# Patient Record
Sex: Female | Born: 1995 | Race: Black or African American | Hispanic: No | Smoking: Former smoker
Health system: Southern US, Community
[De-identification: ages and names within clinical notes are randomized; demographics above are authoritative.]

## PROBLEM LIST (undated history)

## (undated) DIAGNOSIS — J329 Chronic sinusitis, unspecified: Secondary | ICD-10-CM

## (undated) DIAGNOSIS — E669 Obesity, unspecified: Secondary | ICD-10-CM

## (undated) DIAGNOSIS — J302 Other seasonal allergic rhinitis: Secondary | ICD-10-CM

---
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
## 2014-01-24 ENCOUNTER — Encounter (HOSPITAL_COMMUNITY): Payer: Self-pay | Admitting: Emergency Medicine

## 2014-01-24 ENCOUNTER — Emergency Department (HOSPITAL_COMMUNITY)
Admission: EM | Admit: 2014-01-24 | Discharge: 2014-01-24 | Discharge status: Against Medical Advice | Payer: Medicaid Other | Attending: Emergency Medicine | Admitting: Emergency Medicine

## 2014-01-24 DIAGNOSIS — R300 Dysuria: Secondary | ICD-10-CM | POA: Insufficient documentation

## 2014-01-24 DIAGNOSIS — J45909 Unspecified asthma, uncomplicated: Secondary | ICD-10-CM | POA: Insufficient documentation

## 2014-01-24 DIAGNOSIS — E669 Obesity, unspecified: Secondary | ICD-10-CM | POA: Insufficient documentation

## 2014-01-24 NOTE — ED Notes (Signed)
Pt reports dysuria and urinary frequency x 4 days. Denies hematuria. Reports 7/10 bilateral flank pain. Pt  In NAD. Denies fever/chills.

## 2014-01-24 NOTE — ED Notes (Addendum)
The patient was paged and called by name several time and no answer.   The patient left without being seen after triage.

## 2014-01-27 ENCOUNTER — Emergency Department (HOSPITAL_COMMUNITY)
Admission: EM | Admit: 2014-01-27 | Discharge: 2014-01-27 | Disposition: A | Discharge status: Home / Self Care | Payer: Medicaid Other | Attending: Emergency Medicine | Admitting: Emergency Medicine

## 2014-01-27 ENCOUNTER — Encounter (HOSPITAL_COMMUNITY): Payer: Self-pay | Admitting: Emergency Medicine

## 2014-01-27 DIAGNOSIS — N390 Urinary tract infection, site not specified: Secondary | ICD-10-CM | POA: Diagnosis not present

## 2014-01-27 DIAGNOSIS — Z3202 Encounter for pregnancy test, result negative: Secondary | ICD-10-CM | POA: Diagnosis not present

## 2014-01-27 DIAGNOSIS — J45909 Unspecified asthma, uncomplicated: Secondary | ICD-10-CM | POA: Insufficient documentation

## 2014-01-27 DIAGNOSIS — Z792 Long term (current) use of antibiotics: Secondary | ICD-10-CM | POA: Insufficient documentation

## 2014-01-27 DIAGNOSIS — Z880 Allergy status to penicillin: Secondary | ICD-10-CM | POA: Insufficient documentation

## 2014-01-27 DIAGNOSIS — R300 Dysuria: Secondary | ICD-10-CM | POA: Diagnosis present

## 2014-01-27 DIAGNOSIS — E669 Obesity, unspecified: Secondary | ICD-10-CM | POA: Insufficient documentation

## 2014-01-27 LAB — URINE MICROSCOPIC-ADD ON

## 2014-01-27 LAB — URINALYSIS, ROUTINE W REFLEX MICROSCOPIC
BILIRUBIN URINE: NEGATIVE
Glucose, UA: NEGATIVE mg/dL
Ketones, ur: NEGATIVE mg/dL
Nitrite: NEGATIVE
PROTEIN: 100 mg/dL — AB
Specific Gravity, Urine: 1.026 (ref 1.005–1.030)
UROBILINOGEN UA: 1 mg/dL (ref 0.0–1.0)
pH: 6 (ref 5.0–8.0)

## 2014-01-27 LAB — PREGNANCY, URINE: PREG TEST UR: NEGATIVE

## 2014-01-27 MED ORDER — PHENAZOPYRIDINE HCL 200 MG PO TABS: 200.0000 mg | ORAL_TABLET | Freq: Three times a day (TID) | ORAL | Status: AC

## 2014-01-27 MED ORDER — SULFAMETHOXAZOLE-TRIMETHOPRIM 800-160 MG PO TABS: 1.0000 | ORAL_TABLET | Freq: Two times a day (BID) | ORAL | Status: DC

## 2014-01-27 NOTE — ED Provider Notes (Signed)
Medical screening examination/treatment/procedure(s) were performed by non-physician practitioner and as supervising physician I was immediately available for consultation/collaboration.   EKG Interpretation None        Margrete Delude, MD 01/27/14 0410 

## 2014-01-27 NOTE — ED Notes (Signed)
Pt. reports dysuria , cloudy malodorous urine onset 2 days ago , denies  fever or chills.

## 2014-01-27 NOTE — ED Notes (Signed)
Unable to give urine specimen at this time pt. stated she just voided .

## 2014-01-27 NOTE — ED Provider Notes (Signed)
CSN: [id number]     Arrival date & time 01/27/14  0049 History   First MD Initiated Contact with Patient 01/27/14 0136     Chief Complaint  Patient presents with  . Urinary Tract Infection     (Consider location/radiation/quality/duration/timing/severity/associated sxs/prior Treatment) Patient is a 18 y.o. female presenting with urinary tract infection. The history is provided by the patient.  Urinary Tract Infection This is a new problem. The current episode started in the past 7 days. The problem occurs constantly. The problem has been gradually worsening. Associated symptoms include urinary symptoms. She has tried nothing for the symptoms.   Ruth Moore is a 18 y.o. female who presents to the ED with UTI symptoms that started 2 days ago. She complains of dysuria, frequency and cloudy urine with a bad odor. She states she feels the urge to go but then just goes a few drops and feels like she needs to go again. She denies vaginal bleeding or discharge.  Past Medical History  Diagnosis Date  . Asthma   . Sinusitis   . Seasonal allergies   . Obesity    History reviewed. No pertinent past surgical history. No family history on file. History  Substance Use Topics  . Smoking status: Never Smoker   . Smokeless tobacco: Not on file  . Alcohol Use: No   OB History   Grav Para Term Preterm Abortions TAB SAB Ect Mult Living                 Review of Systems  Genitourinary: Positive for dysuria, urgency and frequency.  all other systems negative    Allergies  Penicillins  Home Medications   Prior to Admission medications   Medication Sig Start Date End Date Taking? Authorizing Provider  cephALEXin (KEFLEX) 500 MG capsule Take 1 capsule (500 mg total) by mouth 2 (two) times daily. 12/01/12   [name], PA-C  ibuprofen (ADVIL,MOTRIN) 800 MG tablet Take 1 tablet (800 mg total) by mouth 3 (three) times daily. 12/05/12   Roxy Horseman, PA-C   BP 130/70  Pulse 92   Temp(Src) 98.3 F (36.8 C) (Oral)  Resp 18  SpO2 100%  LMP 01/10/2014 Physical Exam  Nursing note and vitals reviewed. Constitutional: She is oriented to person, place, and time. She appears well-developed and well-nourished.  HENT:  Head: Normocephalic.  Eyes: EOM are normal.  Neck: Neck supple.  Cardiovascular: Normal rate.   Pulmonary/Chest: Effort normal.  Abdominal: Soft. There is tenderness in the suprapubic area. There is no rebound, no guarding and no CVA tenderness.  Musculoskeletal: Normal range of motion.  Neurological: She is alert and oriented to person, place, and time. No cranial nerve deficit.  Skin: Skin is warm and dry.  Psychiatric: She has a normal mood and affect. Her behavior is normal.   Results for orders placed during the hospital encounter of 01/27/14 (from the past 24 hour(s))  URINALYSIS, ROUTINE W REFLEX MICROSCOPIC     Status: Abnormal   Collection Time    01/27/14  1:45 AM      Result Value Ref Range   Color, Urine YELLOW  YELLOW   APPearance TURBID (*) CLEAR   Specific Gravity, Urine 1.026  1.005 - 1.030   pH 6.0  5.0 - 8.0   Glucose, UA NEGATIVE  NEGATIVE mg/dL   Hgb urine dipstick MODERATE (*) NEGATIVE   Bilirubin Urine NEGATIVE  NEGATIVE   Ketones, ur NEGATIVE  NEGATIVE mg/dL   Protein, ur  100 (*) NEGATIVE mg/dL   Urobilinogen, UA 1.0  0.0 - 1.0 mg/dL   Nitrite NEGATIVE  NEGATIVE   Leukocytes, UA LARGE (*) NEGATIVE  PREGNANCY, URINE     Status: None   Collection Time    01/27/14  1:45 AM      Result Value Ref Range   Preg Test, Ur NEGATIVE  NEGATIVE  URINE MICROSCOPIC-ADD ON     Status: Abnormal   Collection Time    01/27/14  1:45 AM      Result Value Ref Range   Squamous Epithelial / LPF FEW (*) RARE   WBC, UA TOO NUMEROUS TO COUNT  <3 WBC/hpf   RBC / HPF 11-20  <3 RBC/hpf   Bacteria, UA MANY (*) RARE    ED Course  Procedures (  MDM  18 y.o. female with UTI symptoms x 2 days. Stable for discharge without n/v, fever or signs  of pyelonephritis. I have reviewed this patient's vital signs, nurses notes, appropriate labs and imaging. I have discussed findings with the patient and plan of care. She voices understanding and agrees with plan.    Medication List    TAKE these medications       phenazopyridine 200 MG tablet  Commonly known as:  PYRIDIUM  Take 1 tablet (200 mg total) by mouth 3 (three) times daily.     sulfamethoxazole-trimethoprim 800-160 MG per tablet  Commonly known as:  SEPTRA DS  Take 1 tablet by mouth every 12 (twelve) hours.      ASK your doctor about these medications       cephALEXin 500 MG capsule  Commonly known as:  KEFLEX  Take 1 capsule (500 mg total) by mouth 2 (two) times daily.     ibuprofen 800 MG tablet  Commonly known as:  ADVIL,MOTRIN  Take 1 tablet (800 mg total) by mouth 3 (three) times daily.            [facility] Orlene Och, [place] 01/27/14 [phone]

## 2015-08-14 ENCOUNTER — Emergency Department (HOSPITAL_COMMUNITY): Payer: No Typology Code available for payment source

## 2015-08-14 ENCOUNTER — Encounter (HOSPITAL_COMMUNITY): Payer: Self-pay | Admitting: Emergency Medicine

## 2015-08-14 ENCOUNTER — Emergency Department (HOSPITAL_COMMUNITY)
Admission: EM | Admit: 2015-08-14 | Discharge: 2015-08-14 | Disposition: A | Discharge status: Home / Self Care | Payer: No Typology Code available for payment source | Attending: Emergency Medicine | Admitting: Emergency Medicine

## 2015-08-14 DIAGNOSIS — S8992XA Unspecified injury of left lower leg, initial encounter: Secondary | ICD-10-CM | POA: Diagnosis not present

## 2015-08-14 DIAGNOSIS — Z880 Allergy status to penicillin: Secondary | ICD-10-CM | POA: Diagnosis not present

## 2015-08-14 DIAGNOSIS — Y9389 Activity, other specified: Secondary | ICD-10-CM | POA: Diagnosis not present

## 2015-08-14 DIAGNOSIS — E669 Obesity, unspecified: Secondary | ICD-10-CM | POA: Insufficient documentation

## 2015-08-14 DIAGNOSIS — Z792 Long term (current) use of antibiotics: Secondary | ICD-10-CM | POA: Insufficient documentation

## 2015-08-14 DIAGNOSIS — Y92410 Unspecified street and highway as the place of occurrence of the external cause: Secondary | ICD-10-CM | POA: Diagnosis not present

## 2015-08-14 DIAGNOSIS — J45909 Unspecified asthma, uncomplicated: Secondary | ICD-10-CM | POA: Insufficient documentation

## 2015-08-14 DIAGNOSIS — Z791 Long term (current) use of non-steroidal anti-inflammatories (NSAID): Secondary | ICD-10-CM | POA: Diagnosis not present

## 2015-08-14 DIAGNOSIS — Y998 Other external cause status: Secondary | ICD-10-CM | POA: Insufficient documentation

## 2015-08-14 DIAGNOSIS — M25562 Pain in left knee: Secondary | ICD-10-CM

## 2015-08-14 DIAGNOSIS — Z79899 Other long term (current) drug therapy: Secondary | ICD-10-CM | POA: Insufficient documentation

## 2015-08-14 MED ORDER — IBUPROFEN 800 MG PO TABS: 800.0000 mg | ORAL_TABLET | Freq: Three times a day (TID) | ORAL | Status: DC
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
## 2015-08-14 NOTE — ED Notes (Signed)
LLE elevated and ice pack place on anterior left knee. Pt describes tingling to her toes on the left foot. Will continue to monitor.

## 2016-04-18 ENCOUNTER — Emergency Department (HOSPITAL_COMMUNITY)
Admission: EM | Admit: 2016-04-18 | Discharge: 2016-04-18 | Disposition: A | Discharge status: Home / Self Care | Payer: Medicaid Other | Attending: Emergency Medicine | Admitting: Emergency Medicine

## 2016-04-18 ENCOUNTER — Encounter (HOSPITAL_COMMUNITY): Payer: Self-pay | Admitting: Emergency Medicine

## 2016-04-18 DIAGNOSIS — M545 Low back pain: Secondary | ICD-10-CM | POA: Diagnosis present

## 2016-04-18 DIAGNOSIS — J45909 Unspecified asthma, uncomplicated: Secondary | ICD-10-CM | POA: Insufficient documentation

## 2016-04-18 DIAGNOSIS — M5441 Lumbago with sciatica, right side: Secondary | ICD-10-CM | POA: Insufficient documentation

## 2016-04-18 MED ORDER — NAPROXEN 500 MG PO TABS: 500.0000 mg | ORAL_TABLET | Freq: Two times a day (BID) | ORAL | 0 refills | Status: DC

## 2016-04-18 MED ORDER — METHOCARBAMOL 500 MG PO TABS: 1000.0000 mg | ORAL_TABLET | Freq: Four times a day (QID) | ORAL | 0 refills | Status: AC

## 2016-04-18 NOTE — ED Triage Notes (Signed)
Patient states she has lower back pain x 2 weeks associated with right leg numbness.  Denies any injury, falls, or trauma.

## 2016-04-18 NOTE — Discharge Instructions (Signed)
Please read and follow all provided instructions.  Your diagnoses today include:  1. Acute right-sided low back pain with right-sided sciatica     Tests performed today include:  Vital signs - see below for your results today  Medications prescribed:   Naproxen - anti-inflammatory pain medication  Do not exceed 500mg  naproxen every 12 hours, take with food  You have been prescribed an anti-inflammatory medication or NSAID. Take with food. Take smallest effective dose for the shortest duration needed for your pain. Stop taking if you experience stomach pain or vomiting.    Robaxin (methocarbamol) - muscle relaxer medication  DO NOT drive or perform any activities that require you to be awake and alert because this medicine can make you drowsy.   Take any prescribed medications only as directed.  Home care instructions:   Follow any educational materials contained in this packet  Please rest, use ice or heat on your back for the next several days  Do not lift, push, pull anything more than 10 pounds for the next week  Follow-up instructions: Please follow-up with your primary care provider in the next 1 week for further evaluation of your symptoms.   Return instructions:  SEEK IMMEDIATE MEDICAL ATTENTION IF YOU HAVE:  New numbness, tingling, weakness, or problem with the use of your arms or legs  Severe back pain not relieved with medications  Loss control of your bowels or bladder  Increasing pain in any areas of the body (such as chest or abdominal pain)  Shortness of breath, dizziness, or fainting.   Worsening nausea (feeling sick to your stomach), vomiting, fever, or sweats  Any other emergent concerns regarding your health   Additional Information:  Your vital signs today were: BP 137/76 (BP Location: Right Arm)    Pulse 86    Temp 98.2 F (36.8 C) (Oral)    Resp 16    Ht 5\' 4"  (1.626 m)    LMP 04/14/2016    SpO2 100%  If your blood pressure (BP) was  elevated above 135/85 this visit, please have this repeated by your doctor within one month. --------------

## 2016-04-18 NOTE — ED Provider Notes (Signed)
[facility] Provider Note   CSN: [id number] Arrival date & time: 04/18/16  1600  By signing my name below, I, Teofilo PodMatthew P. Jamison, attest that this documentation has been prepared under the direction and in the presence of Renne CriglerJoshua Xiadani Damman, [place]. Electronically Signed: Teofilo PodMatthew P. Jamison, ED Scribe. 04/18/2016. 5:25 PM.   History   Chief Complaint Chief Complaint  Patient presents with  . Back Pain    The history is provided by the patient. No language interpreter was used.   HPI Comments:  Ruth Moore is a 20 y.o. female who presents to the Emergency Department complaining of intermittent lower back pain x 2-3weeks. Pt states that the pain comes in multiple brief episodes every day. Pt notes that sometimes she wakes up and the pain is so severe that it is difficult to get out of bed. Pt states that at work today she bent over and then she had a sharp pain in her back radiating down her right leg with some numbness and tingling, and states that she is currently having the same pain. Pt states that the leg pain is at the inner right thigh. Pt describes the pain as sharp and rates the pain at 9/10. Pt states that the pain is exacerbated with bending over and sitting. Pt has used ice, heating pads, tylenol and ibuprofen with no relief. Pt denies any previous similar pain, denies injury/trauma, and denies IV drug use. Pt denies saddle anesthesia, bowel/bladder incontinence, weakness.   Past Medical History:  Diagnosis Date  . Asthma   . Obesity   . Seasonal allergies   . Sinusitis     There are no active problems to display for this patient.   History reviewed. No pertinent surgical history.  OB History    No data available       Home Medications    Prior to Admission medications   Medication Sig Start Date End Date Taking? Authorizing Provider  cephALEXin (KEFLEX) 500 MG capsule Take 1 capsule (500 mg total) by mouth 2 (two) times daily. 12/01/12   Junious SilkHannah Merrell, PA-C    ibuprofen (ADVIL,MOTRIN) 800 MG tablet Take 1 tablet (800 mg total) by mouth 3 (three) times daily. 08/14/15   Elpidio AnisShari Upstill, PA-C  phenazopyridine (PYRIDIUM) 200 MG tablet Take 1 tablet (200 mg total) by mouth 3 (three) times daily. 01/27/14   Hope Orlene OchM Neese, NP  sulfamethoxazole-trimethoprim (SEPTRA DS) 800-160 MG per tablet Take 1 tablet by mouth every 12 (twelve) hours. 01/27/14   Hope Orlene OchM Neese, NP    Family History No family history on file.  Social History Social History  Substance Use Topics  . Smoking status: Never Smoker  . Smokeless tobacco: Never Used  . Alcohol use No     Allergies   Penicillins   Review of Systems Review of Systems  Constitutional: Negative for fever and unexpected weight change.  Gastrointestinal: Negative for constipation.       Negative for fecal incontinence.   Genitourinary: Negative for dysuria, flank pain, hematuria, pelvic pain, vaginal bleeding and vaginal discharge.       Negative for urinary incontinence or retention.  Musculoskeletal: Positive for back pain and myalgias.  Neurological: Positive for numbness. Negative for weakness.       Denies saddle paresthesias.     Physical Exam Updated Vital Signs BP 137/76 (BP Location: Right Arm)   Pulse 86   Temp 98.2 F (36.8 C) (Oral)   Resp 16   Ht 5\' 4"  (1.626 m)  LMP 04/14/2016   SpO2 100%   Physical Exam  Constitutional: She appears well-developed and well-nourished. No distress.  HENT:  Head: Normocephalic and atraumatic.  Eyes: Conjunctivae are normal.  Neck: Normal range of motion. Neck supple.  Cardiovascular: Normal rate.   Pulmonary/Chest: Effort normal.  Abdominal: Soft. She exhibits no distension. There is no tenderness. There is no CVA tenderness.  Musculoskeletal:       Cervical back: She exhibits normal range of motion, no tenderness and no bony tenderness.       Thoracic back: She exhibits normal range of motion, no tenderness and no bony tenderness.       Lumbar  back: She exhibits decreased range of motion (decreased flexion) and tenderness (R>L). She exhibits no bony tenderness.  No step-off noted with palpation of spine.   Neurological: She is alert. She has normal strength and normal reflexes. No sensory deficit.  5/5 strength in entire lower extremities bilaterally. No sensation deficit. Ambulatory without foot drop.  Skin: Skin is warm and dry. No rash noted.  Psychiatric: She has a normal mood and affect.  Nursing note and vitals reviewed.    ED Treatments / Results  DIAGNOSTIC STUDIES:  Oxygen Saturation is 100% on RA, normal by my interpretation.    COORDINATION OF CARE:  5:25 PM Will order naproxen and robaxin. Discussed treatment plan with pt at bedside and pt agreed to plan.   Procedures Procedures (including critical care time)  Initial Impression / Assessment and Plan / ED Course  I have reviewed the triage vital signs and the nursing notes.  Pertinent labs & imaging results that were available during my care of the patient were reviewed by me and considered in my medical decision making (see chart for details).  Clinical Course    Vital signs reviewed and are as follows: Vitals:   04/18/16 1615 04/18/16 1747  BP: 137/76 141/88  Pulse: 86 87  Resp: 16 17  Temp: 98.2 F (36.8 C)     No red flag s/s of low back pain. Patient was counseled on back pain precautions and told to do activity as tolerated but do not lift, push, or pull heavy objects more than 10 pounds for the next week.  Patient counseled to use ice or heat on back for no longer than 15 minutes every hour.   Patient counseled on proper use of muscle relaxant medication.  They were told not to drink alcohol, drive any vehicle, or do any dangerous activities while taking this medication.  Patient verbalized understanding.  Patient urged to follow-up with PCP if pain does not improve with treatment and rest or if pain becomes recurrent. Urged to return with  worsening severe pain, loss of bowel or bladder control, trouble walking.   The patient verbalizes understanding and agrees with the plan.  Final Clinical Impressions(s) / ED Diagnoses   Final diagnoses:  Acute right-sided low back pain with right-sided sciatica   Patient with back pain. No neurological deficits. Patient is ambulatory. No warning symptoms of back pain including: fecal incontinence, urinary retention or overflow incontinence, night sweats, waking from sleep with back pain, unexplained fevers or weight loss, h/o cancer, IVDU, recent trauma. No concern for cauda equina, epidural abscess, or other serious cause of back pain. Conservative measures such as rest, ice/heat and pain medicine indicated with PCP follow-up if no improvement with conservative management.    New Prescriptions Discharge Medication List as of 04/18/2016  5:32 PM    START taking  these medications   Details  methocarbamol (ROBAXIN) 500 MG tablet Take 2 tablets (1,000 mg total) by mouth 4 (four) times daily., Starting Fri 04/18/2016, Print    naproxen (NAPROSYN) 500 MG tablet Take 1 tablet (500 mg total) by mouth 2 (two) times daily., Starting Fri 04/18/2016, Print      I personally performed the services described in this documentation, which was scribed in my presence. The recorded information has been reviewed and is accurate.     Renne Crigler, PA-C 04/18/16 1817    Nira Conn, MD 04/20/16 Marlyne Beards

## 2020-08-01 ENCOUNTER — Encounter (HOSPITAL_COMMUNITY): Payer: Self-pay

## 2020-08-01 ENCOUNTER — Ambulatory Visit (HOSPITAL_COMMUNITY)
Admission: EM | Admit: 2020-08-01 | Discharge: 2020-08-01 | Disposition: A | Discharge status: Home / Self Care | Payer: Self-pay | Attending: Medical Oncology | Admitting: Medical Oncology

## 2020-08-01 ENCOUNTER — Other Ambulatory Visit: Payer: Self-pay

## 2020-08-01 DIAGNOSIS — K047 Periapical abscess without sinus: Secondary | ICD-10-CM

## 2020-08-01 MED ORDER — IBUPROFEN 800 MG PO TABS: 800.0000 mg | ORAL_TABLET | Freq: Three times a day (TID) | ORAL | 0 refills | Status: AC

## 2020-08-01 MED ORDER — CLINDAMYCIN HCL 300 MG PO CAPS: 300.0000 mg | ORAL_CAPSULE | Freq: Three times a day (TID) | ORAL | 0 refills | Status: AC

## 2020-08-01 NOTE — ED Triage Notes (Signed)
Pt in with c/o right side dental pain due to abscess on her tooth.  States she can't get into her dentist until next week

## 2020-08-01 NOTE — ED Provider Notes (Addendum)
[facility]    CSN: [id number] Arrival date & time: 08/01/20  4492      History   Chief Complaint Chief Complaint  Patient presents with  . Dental Pain    HPI Ruth Moore is a 25 y.o. female.   HPI   Dental Pain: Patient states that she has had right-sided dental pain for the past 3 days. No known dental trauma. She believes that she has a dental abscess. She states that she is not able to see her dentist until next week. She has tried tylenol for pain control without much relief. No fevers, trouble swallowing, trouble breathing.   Past Medical History:  Diagnosis Date  . Asthma   . Obesity   . Seasonal allergies   . Sinusitis     There are no problems to display for this patient.   History reviewed. No pertinent surgical history.  OB History   No obstetric history on file.      Home Medications    Prior to Admission medications   Medication Sig Start Date End Date Taking? Authorizing Provider  cephALEXin (KEFLEX) 500 MG capsule Take 1 capsule (500 mg total) by mouth 2 (two) times daily. 12/01/12   Junious Silk, PA-C  methocarbamol (ROBAXIN) 500 MG tablet Take 2 tablets (1,000 mg total) by mouth 4 (four) times daily. 04/18/16   Renne Crigler, PA-C  naproxen (NAPROSYN) 500 MG tablet Take 1 tablet (500 mg total) by mouth 2 (two) times daily. 04/18/16   Renne Crigler, PA-C  phenazopyridine (PYRIDIUM) 200 MG tablet Take 1 tablet (200 mg total) by mouth 3 (three) times daily. 01/27/14   Janne Napoleon, NP  sulfamethoxazole-trimethoprim (SEPTRA DS) 800-160 MG per tablet Take 1 tablet by mouth every 12 (twelve) hours. 01/27/14   Janne Napoleon, NP  fexofenadine (ALLEGRA) 180 MG tablet Take 180 mg by mouth daily.  09/10/11  [provider]    Family History History reviewed. No pertinent family history.  Social History Social History   Tobacco Use  . Smoking status: Never Smoker  . Smokeless tobacco: Never Used  Substance Use Topics  . Alcohol  use: No  . Drug use: No     Allergies   Penicillins   Review of Systems Review of Systems  As stated above in HPI Physical Exam Triage Vital Signs ED Triage Vitals  Enc Vitals Group     BP --      Pulse Rate 08/01/20 0947 80     Resp 08/01/20 0947 20     Temp 08/01/20 0947 98.2 F (36.8 C)     Temp src --      SpO2 08/01/20 0947 100 %     Weight --      Height --      Head Circumference --      Peak Flow --      Pain Score 08/01/20 0946 10     Pain Loc --      Pain Edu? --      Excl. in GC? --    No data found.  Updated Vital Signs Pulse 80   Temp 98.2 F (36.8 C)   Resp 20   LMP 07/26/2020 (Approximate)   SpO2 100%   Physical Exam Vitals and nursing note reviewed.  Constitutional:      General: She is not in acute distress.    Appearance: Normal appearance. She is obese. She is not ill-appearing, toxic-appearing or diaphoretic.  HENT:  Head: Normocephalic and atraumatic.     Nose: Nose normal. No congestion or rhinorrhea.     Mouth/Throat:     Mouth: No angioedema.     Dentition: Abnormal dentition. Dental tenderness, dental caries, dental abscesses and gum lesions present.     Tongue: No lesions. Tongue does not deviate from midline.     Palate: No mass and lesions.     Pharynx: Oropharynx is clear. Uvula midline. No pharyngeal swelling, oropharyngeal exudate, posterior oropharyngeal erythema or uvula swelling.     Tonsils: No tonsillar exudate or tonsillar abscesses.      Comments: NO tenderness, bogginess or edema of inferior or superior palate  Cardiovascular:     Rate and Rhythm: Normal rate and regular rhythm.     Heart sounds: Normal heart sounds.  Pulmonary:     Effort: Pulmonary effort is normal.     Breath sounds: Normal breath sounds.  Musculoskeletal:     Cervical back: Normal range of motion and neck supple.  Lymphadenopathy:     Cervical: No cervical adenopathy.  Neurological:     Mental Status: She is alert.      UC  Treatments / Results  Labs (all labs ordered are listed, but only abnormal results are displayed) Labs Reviewed - No data to display  EKG   Radiology No results found.  Procedures Procedures (including critical care time)  Medications Ordered in UC Medications - No data to display  Initial Impression / Assessment and Plan / UC Course  I have reviewed the triage vital signs and the nursing notes.  Pertinent labs & imaging results that were available during my care of the patient were reviewed by me and considered in my medical decision making (see chart for details).     New.  Discussed with patient.  Given her allergies will treat with clindamycin.  We discussed how to use along common potential side effects, precautions and blackbox warnings.  Also sending in Rx strength ibuprofen to use as needed(not to be taken with naproxen as discussed).  We discussed red flag signs and symptoms in regard to her dental abscess as well.  She will keep her appointment with her dentist for next week. Final Clinical Impressions(s) / UC Diagnoses   Final diagnoses:  None   Discharge Instructions   None    ED Prescriptions    None     PDMP not reviewed this encounter.   Rushie Chestnut, PA-C 08/01/20 1001    Rushie Chestnut, [place] 08/01/20 1002

## 2020-11-23 ENCOUNTER — Other Ambulatory Visit: Payer: Self-pay

## 2020-11-23 ENCOUNTER — Emergency Department (HOSPITAL_BASED_OUTPATIENT_CLINIC_OR_DEPARTMENT_OTHER)
Admission: EM | Admit: 2020-11-23 | Discharge: 2020-11-23 | Disposition: A | Discharge status: Home / Self Care | Payer: Medicaid Other | Attending: Emergency Medicine | Admitting: Emergency Medicine

## 2020-11-23 ENCOUNTER — Encounter (HOSPITAL_BASED_OUTPATIENT_CLINIC_OR_DEPARTMENT_OTHER): Payer: Self-pay | Admitting: Emergency Medicine

## 2020-11-23 DIAGNOSIS — Z8616 Personal history of COVID-19: Secondary | ICD-10-CM | POA: Insufficient documentation

## 2020-11-23 DIAGNOSIS — Z28310 Unvaccinated for covid-19: Secondary | ICD-10-CM | POA: Insufficient documentation

## 2020-11-23 DIAGNOSIS — U071 COVID-19: Secondary | ICD-10-CM | POA: Insufficient documentation

## 2020-11-23 DIAGNOSIS — Z6841 Body Mass Index (BMI) 40.0 and over, adult: Secondary | ICD-10-CM | POA: Insufficient documentation

## 2020-11-23 DIAGNOSIS — J069 Acute upper respiratory infection, unspecified: Secondary | ICD-10-CM

## 2020-11-23 DIAGNOSIS — J45909 Unspecified asthma, uncomplicated: Secondary | ICD-10-CM | POA: Insufficient documentation

## 2020-11-23 LAB — RESP PANEL BY RT-PCR (FLU A&B, COVID) ARPGX2
Influenza A by PCR: NEGATIVE
Influenza B by PCR: NEGATIVE
SARS Coronavirus 2 by RT PCR: POSITIVE — AB

## 2020-11-23 NOTE — Discharge Instructions (Addendum)
Your COVID test is pending.  You can check it under my chart.  If it is positive isolate yourself for 5 days from when the symptoms start and then mask for 5 days after that.  You can follow-up with the COVID care center to see if you would qualify for any other treatments if you are positive
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
## 2020-11-23 NOTE — ED Provider Notes (Signed)
[facility] EMERGENCY DEPT Provider Note   CSN: [id number] Arrival date & time: 11/23/20  1842     History Chief Complaint  Patient presents with   COVID Symptoms    Ruth Moore is a 25 y.o. female.  HPI Patient presents with URI symptoms since yesterday.  Fever cough headaches.  Some diarrhea.  Some muscle aches 2.  Had COVID in September last year and states this feels the same.  No known definite contacts.  History of asthma.  Patient is also obese.  Mild shortness of breath still able to do her normal activities.  She is worried she has COVID again.    Past Medical History:  Diagnosis Date   Asthma    Obesity    Seasonal allergies    Sinusitis     There are no problems to display for this patient.   History reviewed. No pertinent surgical history.   OB History   No obstetric history on file.     History reviewed. No pertinent family history.  Social History   Tobacco Use   Smoking status: Never   Smokeless tobacco: Never  Substance Use Topics   Alcohol use: No   Drug use: No    Home Medications Prior to Admission medications   Medication Sig Start Date End Date Taking? Authorizing Provider  ibuprofen (ADVIL) 800 MG tablet Take 1 tablet (800 mg total) by mouth 3 (three) times daily. 08/01/20   Rushie Chestnut, PA-C  methocarbamol (ROBAXIN) 500 MG tablet Take 2 tablets (1,000 mg total) by mouth 4 (four) times daily. 04/18/16   Renne Crigler, PA-C  phenazopyridine (PYRIDIUM) 200 MG tablet Take 1 tablet (200 mg total) by mouth 3 (three) times daily. 01/27/14   Janne Napoleon, NP  fexofenadine (ALLEGRA) 180 MG tablet Take 180 mg by mouth daily.  09/10/11  [provider]    Allergies    Penicillins  Review of Systems   Review of Systems  Constitutional:  Positive for appetite change and fatigue.  HENT:  Negative for congestion.   Respiratory:  Positive for cough and shortness of breath.   Gastrointestinal:  Positive for diarrhea.   Genitourinary:  Negative for flank pain.  Musculoskeletal:  Positive for myalgias. Negative for back pain.  Skin:  Negative for rash.  Neurological:  Negative for weakness.  Psychiatric/Behavioral:  Negative for confusion.    Physical Exam Updated Vital Signs BP (!) 144/95 (BP Location: Right Arm)   Pulse (!) 109   Temp 99.4 F (37.4 C) (Oral)   Resp 16   Ht 5\' 5"  (1.651 m)   Wt 135.2 kg   SpO2 99%   BMI 49.59 kg/m   Physical Exam Vitals reviewed.  Constitutional:      Appearance: She is obese.  HENT:     Head: Atraumatic.  Eyes:     Pupils: Pupils are equal, round, and reactive to light.  Cardiovascular:     Rate and Rhythm: Regular rhythm.     Comments: Mild tachycardia Pulmonary:     Breath sounds: No wheezing, rhonchi or rales.  Abdominal:     Tenderness: There is no abdominal tenderness.  Musculoskeletal:        General: No tenderness.     Cervical back: Neck supple.  Skin:    General: Skin is warm.     Capillary Refill: Capillary refill takes less than 2 seconds.  Neurological:     Mental Status: She is alert and oriented to person, place,  and time.    ED Results / Procedures / Treatments   Labs (all labs ordered are listed, but only abnormal results are displayed) Labs Reviewed  RESP PANEL BY RT-PCR (FLU A&B, COVID) ARPGX2    EKG None  Radiology No results found.  Procedures Procedures   Medications Ordered in ED Medications - No data to display  ED Course  I have reviewed the triage vital signs and the nursing notes.  Pertinent labs & imaging results that were available during my care of the patient were reviewed by me and considered in my medical decision making (see chart for details).    MDM Rules/Calculators/A&P                          Patient with URI symptoms.  Previously had COVID but it was nearly a year ago.  Patient is also unvaccinated obese and has asthma.  COVID test done but not resulted at time of discharge.  Given  information for COVID follow-up clinic since patient could be benefit of other treatment.  Potentially would be a candidate for antiviral and antibody infusion.  However lungs are clear.  Well-appearing.  Discharge home. Final Clinical Impression(s) / ED Diagnoses Final diagnoses:  Upper respiratory tract infection, unspecified type    Rx / DC Orders ED Discharge Orders     None        Benjiman Core, MD 11/23/20 1921

## 2020-11-23 NOTE — ED Triage Notes (Signed)
Pt arrives with x2 days of fever, cough, headache, diarrhea, body aches.

## 2020-11-27 ENCOUNTER — Other Ambulatory Visit (HOSPITAL_COMMUNITY): Payer: Self-pay

## 2020-11-27 ENCOUNTER — Other Ambulatory Visit: Payer: Self-pay

## 2020-11-27 ENCOUNTER — Telehealth (INDEPENDENT_AMBULATORY_CARE_PROVIDER_SITE_OTHER): Payer: Medicaid Other | Admitting: Nurse Practitioner

## 2020-11-27 DIAGNOSIS — U071 COVID-19: Secondary | ICD-10-CM

## 2020-11-27 MED ORDER — NIRMATRELVIR/RITONAVIR (PAXLOVID)TABLET
3.0000 | ORAL_TABLET | Freq: Two times a day (BID) | ORAL | 0 refills | Status: AC
Start: 2020-11-27 — End: 2020-12-02
  Filled 2020-11-27: qty 30, 5d supply, fill #0

## 2020-11-27 NOTE — Patient Instructions (Signed)
Covid 19 Cough:   Stay well hydrated  Stay active  Deep breathing exercises  May start vitamin C 2,000 mg daily, vitamin D3 2,000 IU daily, Zinc 220 mg daily  May take tylenol for fever or pain  You are being prescribed PAXLOVID for COVID-19 infection.     Please pick up your prescription at: [facility] pharmacy  Address:  [address] [place], [place] [address]         Hours:  Monday - Friday 7:30 am - 6PM                         Saturday 8:00 am - 4:30 pm      Sunday Closed  Phone: [phone]     Please call the pharmacy or go through the drive through vs going inside if you are picking up the mediation yourself to prevent further spread. If prescribed to a [facility] affiliated pharmacy, a pharmacist will bring the medication out to your car.   Medications to hold while taking this treatment: NA - not currently on any medications *If asked to hold, you can resume them 24 hours after your last dose   ADMINISTRATION INSTRUCTIONS: Take with or without food. Swallow the tablets whole. Don't chew, crush, or break the medications because it might not work as well  For each dose of the medication, you should be taking 3 tablets together (2 pink oval and 1 white oval) TWICE a day for FIVE days   Finish your full five-day course of Paxlovid even if you feel better before you're done. Stopping this medication too early can make it less effective to prevent severe illness related to COVID19.    Paxlovid is prescribed for YOU ONLY. Don't share it with others, even if they have similar symptoms as you. This medication might not be right for everyone.  Make sure to take steps to protect yourself and others while you're taking this medication in order to get well soon and to prevent others from getting sick with COVID-19.  Paxlovid (nirmatrelvir / ritonavir) can cause hormonal birth control medications to not work well. If you or your partner is currently taking hormonal birth  control, use condoms or other birth control methods to prevent unintended pregnancies.    COMMON SIDE EFFECTS: Altered or bad taste in your mouth  Diarrhea  High blood pressure (1% of people) Muscle aches (1% of people)     If your COVID-19 symptoms get worse, get medical help right away. Call 911 if you experience symptoms such as worsening cough, trouble breathing, chest pain that doesn't go away, confusion, a hard time staying awake, and pale or blue-colored skin. This medication won't prevent all COVID-19 cases from getting worse.      Follow up:  Follow up in 2 weeks or sooner if needed

## 2020-11-27 NOTE — Progress Notes (Signed)
Virtual Visit via Telephone Note  I connected with Ruth Moore on 11/27/20 at 10:00 AM EDT by telephone and verified that I am speaking with the correct person using two identifiers.  Location: Patient: home Provider: office   I discussed the limitations, risks, security and privacy concerns of performing an evaluation and management service by telephone and the availability of in person appointments. I also discussed with the patient that there may be a patient responsible charge related to this service. The patient expressed understanding and agreed to proceed.   History of Present Illness:  Patient presents today for post-COVID care visit through televisit.  Patient tested positive for COVID on July 8.  She states that her symptoms started the same day.  She did have COVID a year ago and did develop pneumonia.  She states that she is currently having symptoms of nasal congestion, cough, headaches.  She denies any significant shortness of breath or fever. Denies f/c/s, n/v/d, hemoptysis, PND, chest pain or edema     Observations/Objective:  Vitals with BMI 11/23/2020 11/23/2020 11/23/2020  Height - 5\' 5"  -  Weight - 298 lbs -  BMI - 49.59 -  Systolic 133 - 144  Diastolic 86 - 95  Pulse 102 -      Assessment and Plan:  Covid 19 Cough:   Stay well hydrated  Stay active  Deep breathing exercises  May start vitamin C 2,000 mg daily, vitamin D3 2,000 IU daily, Zinc 220 mg daily  May take tylenol for fever or pain  You are being prescribed PAXLOVID for COVID-19 infection.     Please pick up your prescription at: [facility] pharmacy  Address:  [address] [place], [place]         Hours:  Monday - Friday 7:30 am - 6PM                         Saturday 8:00 am - 4:30 pm      Sunday Closed  Phone: [phone]     Please call the pharmacy or go through the drive through vs going inside if you are picking up the mediation yourself to prevent further  spread. If prescribed to a [facility] affiliated pharmacy, a pharmacist will bring the medication out to your car.   Medications to hold while taking this treatment: NA - not currently on any medications *If asked to hold, you can resume them 24 hours after your last dose   ADMINISTRATION INSTRUCTIONS: Take with or without food. Swallow the tablets whole. Don't chew, crush, or break the medications because it might not work as well  For each dose of the medication, you should be taking 3 tablets together (2 pink oval and 1 white oval) TWICE a day for FIVE days   Finish your full five-day course of Paxlovid even if you feel better before you're done. Stopping this medication too early can make it less effective to prevent severe illness related to COVID19.    Paxlovid is prescribed for YOU ONLY. Don't share it with others, even if they have similar symptoms as you. This medication might not be right for everyone.  Make sure to take steps to protect yourself and others while you're taking this medication in order to get well soon and to prevent others from getting sick with COVID-19.  Paxlovid (nirmatrelvir / ritonavir) can cause hormonal birth control medications to not work well. If you or your  partner is currently taking hormonal birth control, use condoms or other birth control methods to prevent unintended pregnancies.    COMMON SIDE EFFECTS: Altered or bad taste in your mouth  Diarrhea  High blood pressure (1% of people) Muscle aches (1% of people)     If your COVID-19 symptoms get worse, get medical help right away. Call 911 if you experience symptoms such as worsening cough, trouble breathing, chest pain that doesn't go away, confusion, a hard time staying awake, and pale or blue-colored skin. This medication won't prevent all COVID-19 cases from getting worse.      Follow up:  Follow up in 2 weeks or sooner if needed      I discussed the assessment and treatment  plan with the patient. The patient was provided an opportunity to ask questions and all were answered. The patient agreed with the plan and demonstrated an understanding of the instructions.   The patient was advised to call back or seek an in-person evaluation if the symptoms worsen or if the condition fails to improve as anticipated.  I provided 23 minutes of non-face-to-face time during this encounter.   Ivonne Andrew, NP

## 2020-11-28 ENCOUNTER — Other Ambulatory Visit: Payer: Self-pay

## 2020-12-11 ENCOUNTER — Ambulatory Visit: Payer: Medicaid Other

## 2021-01-28 ENCOUNTER — Encounter (HOSPITAL_BASED_OUTPATIENT_CLINIC_OR_DEPARTMENT_OTHER): Payer: Self-pay

## 2021-01-28 ENCOUNTER — Emergency Department (HOSPITAL_BASED_OUTPATIENT_CLINIC_OR_DEPARTMENT_OTHER)
Admission: EM | Admit: 2021-01-28 | Discharge: 2021-01-28 | Disposition: A | Discharge status: Home / Self Care | Payer: Medicaid Other | Attending: Emergency Medicine | Admitting: Emergency Medicine

## 2021-01-28 ENCOUNTER — Other Ambulatory Visit: Payer: Self-pay

## 2021-01-28 DIAGNOSIS — R110 Nausea: Secondary | ICD-10-CM | POA: Insufficient documentation

## 2021-01-28 DIAGNOSIS — R197 Diarrhea, unspecified: Secondary | ICD-10-CM | POA: Insufficient documentation

## 2021-01-28 DIAGNOSIS — R1084 Generalized abdominal pain: Secondary | ICD-10-CM | POA: Insufficient documentation

## 2021-01-28 DIAGNOSIS — Z5321 Procedure and treatment not carried out due to patient leaving prior to being seen by health care provider: Secondary | ICD-10-CM | POA: Insufficient documentation

## 2021-01-28 NOTE — ED Triage Notes (Signed)
Pt arrives POV with c/o of 1 day history of diarrhea, has had about 4 episodes of diarrhea today.  Reports some generalized abdominal pain.  Reports nausea but no vomiting.

## 2021-03-23 ENCOUNTER — Other Ambulatory Visit: Payer: Self-pay

## 2021-03-23 ENCOUNTER — Encounter (HOSPITAL_BASED_OUTPATIENT_CLINIC_OR_DEPARTMENT_OTHER): Payer: Self-pay | Admitting: Emergency Medicine

## 2021-03-23 ENCOUNTER — Emergency Department (HOSPITAL_BASED_OUTPATIENT_CLINIC_OR_DEPARTMENT_OTHER)
Admission: EM | Admit: 2021-03-23 | Discharge: 2021-03-23 | Disposition: A | Discharge status: Home / Self Care | Payer: Medicaid Other | Attending: Emergency Medicine | Admitting: Emergency Medicine

## 2021-03-23 DIAGNOSIS — J45909 Unspecified asthma, uncomplicated: Secondary | ICD-10-CM | POA: Insufficient documentation

## 2021-03-23 DIAGNOSIS — M5431 Sciatica, right side: Secondary | ICD-10-CM | POA: Insufficient documentation

## 2021-03-23 DIAGNOSIS — F17210 Nicotine dependence, cigarettes, uncomplicated: Secondary | ICD-10-CM | POA: Insufficient documentation

## 2021-03-23 MED ORDER — PREDNISONE 50 MG PO TABS
60.0000 mg | ORAL_TABLET | Freq: Once | ORAL | Status: AC
  Administered 2021-03-23: 60 mg via ORAL
  Filled 2021-03-23: qty 1

## 2021-03-23 MED ORDER — PREDNISONE 10 MG PO TABS: 40.0000 mg | ORAL_TABLET | Freq: Every day | ORAL | 0 refills | Status: AC

## 2021-03-23 NOTE — Discharge Instructions (Addendum)
Take the prednisone as directed.  For the next 5 days.  Rest off your feet is much as possible.  Make an appointment follow-up with the wellness clinic.  Work note provided.  Return for any new or worse symptoms.  Symptoms seem to be consistent with right-sided sciatica.

## 2021-03-23 NOTE — ED Provider Notes (Signed)
[facility] EMERGENCY DEPT Provider Note   CSN: [id number] Arrival date & time: 03/23/21  1913     History Chief Complaint  Patient presents with   Leg Pain    Right     Ruth Moore is a 25 y.o. female.  Patient with a complaint of pain radiating down the back of the right leg for about 5 to 6 days.  There is some numbness to the right great toe.  But that is actually been there for a while.  No back pain.  No left lower extremity pain no incontinence.  No known history of injury.  Patient stated that she has been told in the past that she had sciatica.  Patient does not have a primary care doctor.      Past Medical History:  Diagnosis Date   Asthma    Obesity    Seasonal allergies    Sinusitis     There are no problems to display for this patient.   No past surgical history on file.   OB History   No obstetric history on file.     No family history on file.  Social History   Tobacco Use   Smoking status: Every Day    Packs/day: 0.20    Years: 5.00    Pack years: 1.00    Types: Cigarettes   Smokeless tobacco: Never  Vaping Use   Vaping Use: Every day  Substance Use Topics   Alcohol use: No   Drug use: No    Home Medications Prior to Admission medications   Medication Sig Start Date End Date Taking? Authorizing Provider  predniSONE (DELTASONE) 10 MG tablet Take 4 tablets (40 mg total) by mouth daily. 03/23/21  Yes Vanetta Mulders, MD  ibuprofen (ADVIL) 800 MG tablet Take 1 tablet (800 mg total) by mouth 3 (three) times daily. 08/01/20   Rushie Chestnut, PA-C  methocarbamol (ROBAXIN) 500 MG tablet Take 2 tablets (1,000 mg total) by mouth 4 (four) times daily. Patient not taking: Reported on 01/28/2021 04/18/16   Renne Crigler, PA-C  phenazopyridine (PYRIDIUM) 200 MG tablet Take 1 tablet (200 mg total) by mouth 3 (three) times daily. Patient not taking: Reported on 01/28/2021 01/27/14   Janne Napoleon, NP  fexofenadine (ALLEGRA) 180 MG  tablet Take 180 mg by mouth daily.  09/10/11  [provider]    Allergies    Penicillins  Review of Systems   Review of Systems  Constitutional:  Negative for chills and fever.  HENT:  Negative for ear pain and sore throat.   Eyes:  Negative for pain and visual disturbance.  Respiratory:  Negative for cough and shortness of breath.   Cardiovascular:  Negative for chest pain and palpitations.  Gastrointestinal:  Negative for abdominal pain and vomiting.  Genitourinary:  Negative for dysuria and hematuria.  Musculoskeletal:  Negative for arthralgias, back pain and neck pain.  Skin:  Negative for color change and rash.  Neurological:  Positive for numbness. Negative for seizures and syncope.  All other systems reviewed and are negative.  Physical Exam Updated Vital Signs BP (!) 161/85   Pulse (!) 106   Temp 98.6 F (37 C)   Resp 16   Ht 1.676 m (5\' 6" )   Wt (!) 142.4 kg   LMP 03/11/2021 (Approximate)   SpO2 100%   BMI 50.68 kg/m   Physical Exam Vitals and nursing note reviewed.  Constitutional:      General: She is not  in acute distress.    Appearance: Normal appearance. She is well-developed. She is obese.  HENT:     Head: Normocephalic and atraumatic.  Eyes:     Extraocular Movements: Extraocular movements intact.     Conjunctiva/sclera: Conjunctivae normal.     Pupils: Pupils are equal, round, and reactive to light.  Cardiovascular:     Rate and Rhythm: Normal rate and regular rhythm.     Heart sounds: No murmur heard. Pulmonary:     Effort: Pulmonary effort is normal. No respiratory distress.     Breath sounds: Normal breath sounds.  Abdominal:     Palpations: Abdomen is soft.     Tenderness: There is no abdominal tenderness.  Musculoskeletal:        General: No swelling or tenderness. Normal range of motion.     Cervical back: Normal range of motion and neck supple.  Skin:    General: Skin is warm and dry.     Capillary Refill: Capillary refill  takes less than 2 seconds.  Neurological:     General: No focal deficit present.     Mental Status: She is alert and oriented to person, place, and time.     Cranial Nerves: No cranial nerve deficit.     Sensory: Sensory deficit present.     Motor: No weakness.     Comments: Some numbness to the top of the right great toe.  And at the forefoot area.  Good cap refill.  Dorsalis pedis pulse 2+.  Good range of motion of the toes and the ankle.  No back tenderness in the lumbar area.  Left lower extremity normal.    ED Results / Procedures / Treatments   Labs (all labs ordered are listed, but only abnormal results are displayed) Labs Reviewed - No data to display  EKG None  Radiology No results found.  Procedures Procedures   Medications Ordered in ED Medications  predniSONE (DELTASONE) tablet 60 mg (has no administration in time range)    ED Course  I have reviewed the triage vital signs and the nursing notes.  Pertinent labs & imaging results that were available during my care of the patient were reviewed by me and considered in my medical decision making (see chart for details).    MDM Rules/Calculators/A&P                           Some seem to be consistent with sciatica pattern.  We will treat with prednisone.  Give patient wellness clinic to follow-up with.  No evidence of any cauda equina syndrome. Final Clinical Impression(s) / ED Diagnoses Final diagnoses:  Sciatica of right side    Rx / DC Orders ED Discharge Orders          Ordered    predniSONE (DELTASONE) 10 MG tablet  Daily        03/23/21 2339             Fredia Sorrow, MD 03/23/21 2343

## 2021-03-23 NOTE — ED Triage Notes (Signed)
Pt c/o posterior right leg pain x5-6 days. Reports having sciatica in the past. Also expressed concern about numbness in right great toe. Taken OTC meds with no relief.

## 2021-03-23 NOTE — ED Notes (Signed)
Pt verbalizes understanding of discharge instructions. Opportunity for questioning and answers were provided. Armand removed by staff, pt discharged from ED to home. Educated to pick up Rx.  

## 2021-06-24 ENCOUNTER — Other Ambulatory Visit: Payer: Self-pay

## 2021-06-24 ENCOUNTER — Encounter (HOSPITAL_BASED_OUTPATIENT_CLINIC_OR_DEPARTMENT_OTHER): Payer: Self-pay | Admitting: Urology

## 2021-06-24 ENCOUNTER — Emergency Department (HOSPITAL_BASED_OUTPATIENT_CLINIC_OR_DEPARTMENT_OTHER)
Admission: EM | Admit: 2021-06-24 | Discharge: 2021-06-24 | Disposition: A | Discharge status: Home / Self Care | Payer: BC Managed Care – PPO | Attending: Emergency Medicine | Admitting: Emergency Medicine

## 2021-06-24 ENCOUNTER — Emergency Department (HOSPITAL_BASED_OUTPATIENT_CLINIC_OR_DEPARTMENT_OTHER): Payer: BC Managed Care – PPO

## 2021-06-24 DIAGNOSIS — U071 COVID-19: Secondary | ICD-10-CM | POA: Diagnosis not present

## 2021-06-24 DIAGNOSIS — Z28310 Unvaccinated for covid-19: Secondary | ICD-10-CM | POA: Insufficient documentation

## 2021-06-24 DIAGNOSIS — M791 Myalgia, unspecified site: Secondary | ICD-10-CM | POA: Diagnosis present

## 2021-06-24 LAB — RESP PANEL BY RT-PCR (FLU A&B, COVID) ARPGX2
Influenza A by PCR: NEGATIVE
Influenza B by PCR: NEGATIVE
SARS Coronavirus 2 by RT PCR: POSITIVE — AB

## 2021-06-24 NOTE — ED Provider Notes (Signed)
[facility] EMERGENCY DEPT Provider Note   CSN: [id number] Arrival date & time: 06/24/21  1001     History  Chief Complaint  Patient presents with   Covid Positive    Ruth Moore is a 26 y.o. female.  HPI 26 year old female history of COVID infection x2 presents today with symptoms consistent with COVID.  She states her symptoms began yesterday with sore throat, chills, fatigue, body aches.  She has not had any difficulty breathing.  She has been taking p.o. without difficulty.  He has not had COVID-vaccine in the past.     Home Medications Prior to Admission medications   Medication Sig Start Date End Date Taking? Authorizing Provider  ibuprofen (ADVIL) 800 MG tablet Take 1 tablet (800 mg total) by mouth 3 (three) times daily. 08/01/20   Rushie Chestnut, PA-C  methocarbamol (ROBAXIN) 500 MG tablet Take 2 tablets (1,000 mg total) by mouth 4 (four) times daily. Patient not taking: Reported on 01/28/2021 04/18/16   Renne Crigler, PA-C  phenazopyridine (PYRIDIUM) 200 MG tablet Take 1 tablet (200 mg total) by mouth 3 (three) times daily. Patient not taking: Reported on 01/28/2021 01/27/14   Janne Napoleon, NP  predniSONE (DELTASONE) 10 MG tablet Take 4 tablets (40 mg total) by mouth daily. 03/23/21   Vanetta Mulders, MD  fexofenadine (ALLEGRA) 180 MG tablet Take 180 mg by mouth daily.  09/10/11  [provider]      Allergies    Penicillins    Review of Systems   Review of Systems  All other systems reviewed and are negative.  Physical Exam Updated Vital Signs BP 136/79 (BP Location: Right Arm)    Pulse 89    Temp 98.4 F (36.9 C) (Oral)    Resp 18    Ht 1.676 m (5\' 6" )    Wt (!) 143.8 kg    LMP 06/19/2021 (Approximate)    SpO2 100%    BMI 51.17 kg/m  Physical Exam Vitals and nursing note reviewed.  HENT:     Head: Normocephalic.     Right Ear: External ear normal.     Left Ear: External ear normal.     Nose: Nose normal.     Mouth/Throat:      Pharynx: Oropharynx is clear.  Eyes:     Pupils: Pupils are equal, round, and reactive to light.  Cardiovascular:     Rate and Rhythm: Normal rate and regular rhythm.  Pulmonary:     Effort: Pulmonary effort is normal.     Breath sounds: Normal breath sounds.  Abdominal:     General: Bowel sounds are normal.     Palpations: Abdomen is soft.  Musculoskeletal:        General: Normal range of motion.     Cervical back: Normal range of motion.  Skin:    General: Skin is warm.     Capillary Refill: Capillary refill takes less than 2 seconds.  Neurological:     General: No focal deficit present.     Mental Status: She is alert.  Psychiatric:        Mood and Affect: Mood normal.    ED Results / Procedures / Treatments   Labs (all labs ordered are listed, but only abnormal results are displayed) Labs Reviewed  RESP PANEL BY RT-PCR (FLU A&B, COVID) ARPGX2 - Abnormal; Notable for the following components:      Result Value   SARS Coronavirus 2 by RT PCR POSITIVE (*)  All other components within normal limits    EKG None  Radiology DG Chest [facility] 1 View  Result Date: 06/24/2021 CLINICAL DATA:  COVID-19 positive. EXAM: PORTABLE CHEST 1 VIEW COMPARISON:  None. FINDINGS: The heart size and mediastinal contours are within normal limits. Both lungs are clear. The visualized skeletal structures are unremarkable. IMPRESSION: No active disease. Electronically Signed   By: Lupita Raider M.D.   On: 06/24/2021 11:53    Procedures Procedures    Medications Ordered in ED Medications - No data to display  ED Course/ Medical Decision Making/ A&P                           Medical Decision Making 26 year old female presents today with symptoms consistent with COVID with positive COVID test.  She has had COVID-pneumonia in the past.  Today she does not appear to be short of breath.  Her chest x-Anis Degidio is clear.  Oxygen saturation is normal at 100%.  Blood pressure slightly elevated 136/79.   She is afebrile and heart rate is normal with normal respiratory rate.  Patient appears stable for discharge home.  We have discussed return precautions and need for follow-up and she voices understanding.  Amount and/or Complexity of Data Reviewed Labs: ordered. Decision-making details documented in ED Course. Radiology: ordered.          Final Clinical Impression(s) / ED Diagnoses Final diagnoses:  COVID    Rx / DC Orders ED Discharge Orders     None         Margarita Grizzle, MD 06/24/21 1217

## 2021-06-24 NOTE — ED Triage Notes (Signed)
COVID positive home test this am Sore throat, chills, fatigue,a nd body aches started yesterday  NAD at this time

## 2021-12-25 ENCOUNTER — Other Ambulatory Visit: Payer: Self-pay

## 2021-12-25 ENCOUNTER — Encounter (HOSPITAL_BASED_OUTPATIENT_CLINIC_OR_DEPARTMENT_OTHER): Payer: Self-pay | Admitting: Emergency Medicine

## 2021-12-25 DIAGNOSIS — Z20822 Contact with and (suspected) exposure to covid-19: Secondary | ICD-10-CM | POA: Insufficient documentation

## 2021-12-25 DIAGNOSIS — Z7951 Long term (current) use of inhaled steroids: Secondary | ICD-10-CM | POA: Insufficient documentation

## 2021-12-25 DIAGNOSIS — J029 Acute pharyngitis, unspecified: Secondary | ICD-10-CM | POA: Insufficient documentation

## 2021-12-25 DIAGNOSIS — J45909 Unspecified asthma, uncomplicated: Secondary | ICD-10-CM | POA: Insufficient documentation

## 2021-12-25 LAB — RESP PANEL BY RT-PCR (FLU A&B, COVID) ARPGX2
Influenza A by PCR: NEGATIVE
Influenza B by PCR: NEGATIVE
SARS Coronavirus 2 by RT PCR: NEGATIVE

## 2021-12-25 LAB — GROUP A STREP BY PCR: Group A Strep by PCR: NOT DETECTED

## 2021-12-25 NOTE — ED Triage Notes (Signed)
Throat pain x 2 days. Painful to swallow, hoarseness in voice. Chills at home. Did not take any OTC meds. Denies sick contact

## 2021-12-26 ENCOUNTER — Emergency Department (HOSPITAL_BASED_OUTPATIENT_CLINIC_OR_DEPARTMENT_OTHER): Payer: Medicaid Other

## 2021-12-26 ENCOUNTER — Emergency Department (HOSPITAL_BASED_OUTPATIENT_CLINIC_OR_DEPARTMENT_OTHER)
Admission: EM | Admit: 2021-12-26 | Discharge: 2021-12-26 | Disposition: A | Discharge status: Home / Self Care | Payer: Medicaid Other | Attending: Emergency Medicine | Admitting: Emergency Medicine

## 2021-12-26 DIAGNOSIS — J029 Acute pharyngitis, unspecified: Secondary | ICD-10-CM

## 2021-12-26 MED ORDER — LIDOCAINE VISCOUS HCL 2 % MT SOLN: 15.0000 mL | OROMUCOSAL | 0 refills | Status: AC | PRN

## 2021-12-26 MED ORDER — ACETAMINOPHEN 325 MG PO TABS: 650.0000 mg | ORAL_TABLET | Freq: Four times a day (QID) | ORAL | 0 refills | Status: AC | PRN

## 2021-12-26 MED ORDER — IBUPROFEN 800 MG PO TABS
800.0000 mg | ORAL_TABLET | Freq: Once | ORAL | Status: AC
  Administered 2021-12-26: 800 mg via ORAL
  Filled 2021-12-26: qty 1

## 2021-12-26 MED ORDER — IBUPROFEN 800 MG PO TABS: 800.0000 mg | ORAL_TABLET | Freq: Four times a day (QID) | ORAL | 0 refills | Status: AC | PRN

## 2021-12-26 MED ORDER — LIDOCAINE VISCOUS HCL 2 % MT SOLN
15.0000 mL | Freq: Once | OROMUCOSAL | Status: AC
  Administered 2021-12-26: 15 mL via OROMUCOSAL
  Filled 2021-12-26: qty 15

## 2021-12-26 MED ORDER — DEXAMETHASONE SODIUM PHOSPHATE 10 MG/ML IJ SOLN
10.0000 mg | Freq: Once | INTRAMUSCULAR | Status: AC
  Administered 2021-12-26: 10 mg
  Filled 2021-12-26: qty 1

## 2021-12-26 MED ORDER — FLUTICASONE PROPIONATE 50 MCG/ACT NA SUSP: 1.0000 | Freq: Every day | NASAL | 0 refills | Status: AC

## 2021-12-26 MED ORDER — MENTHOL 3 MG MT LOZG: 1.0000 | LOZENGE | OROMUCOSAL | 0 refills | Status: AC | PRN

## 2021-12-26 NOTE — Discharge Instructions (Signed)
It was a pleasure caring for you today in the emergency department. ° °Please return to the emergency department for any worsening or worrisome symptoms. ° ° °

## 2021-12-26 NOTE — ED Provider Notes (Signed)
[facility] EMERGENCY DEPT Provider Note   CSN: [id number] Arrival date & time: 12/25/21  1752     History  Chief Complaint  Patient presents with   Sore Throat    Ruth Moore is a 26 y.o. female.  Patient as above with significant medical history as below, including asthma, obesity, seasonal allergies who presents to the ED with complaint of sore throat.  Symptom onset 1.5 days ago.  Patient reports his throat is very scratchy, some difficulty swallowing secondary to discomfort.  He is tolerating his own secretions.  No fever.  Spouse with similar symptoms.  No nausea or vomiting.  No dyspnea.  Feels his voice is mildly hoarse and was able to speak without significant difficulty.  No medications prior to arrival.  No facial swelling or rashes.  No dental pain.  No recent travel.    Past Medical History:  Diagnosis Date   Asthma    Obesity    Seasonal allergies    Sinusitis     History reviewed. No pertinent surgical history.   The history is provided by the patient and the spouse. No language interpreter was used.  Sore Throat Pertinent negatives include no chest pain, no abdominal pain, no headaches and no shortness of breath.       Home Medications Prior to Admission medications   Medication Sig Start Date End Date Taking? Authorizing Provider  acetaminophen (TYLENOL) 325 MG tablet Take 2 tablets (650 mg total) by mouth every 6 (six) hours as needed. 12/26/21  Yes [name] A, DO  fluticasone (FLONASE) 50 MCG/ACT nasal spray Place 1 spray into both nostrils daily for 14 days. 12/26/21 01/09/22 Yes [name] A, DO  ibuprofen (ADVIL) 800 MG tablet Take 1 tablet (800 mg total) by mouth every 6 (six) hours as needed (take with food). 12/26/21  Yes [name] A, DO  lidocaine (XYLOCAINE) 2 % solution Use as directed 15 mLs in the mouth or throat as needed for mouth pain. 12/26/21  Yes Sloan Leiter, DO  menthol-cetylpyridinium (CEPACOL) 3 MG lozenge Take 1  lozenge (3 mg total) by mouth as needed for sore throat. 12/26/21  Yes [name] A, DO  ibuprofen (ADVIL) 800 MG tablet Take 1 tablet (800 mg total) by mouth 3 (three) times daily. 08/01/20   Rushie Chestnut, PA-C  methocarbamol (ROBAXIN) 500 MG tablet Take 2 tablets (1,000 mg total) by mouth 4 (four) times daily. Patient not taking: Reported on 01/28/2021 04/18/16   Renne Crigler, PA-C  phenazopyridine (PYRIDIUM) 200 MG tablet Take 1 tablet (200 mg total) by mouth 3 (three) times daily. Patient not taking: Reported on 01/28/2021 01/27/14   Janne Napoleon, NP  predniSONE (DELTASONE) 10 MG tablet Take 4 tablets (40 mg total) by mouth daily. 03/23/21   Vanetta Mulders, MD  fexofenadine (ALLEGRA) 180 MG tablet Take 180 mg by mouth daily.  09/10/11  [provider]      Allergies    Penicillins    Review of Systems   Review of Systems  Constitutional:  Negative for activity change and fever.  HENT:  Positive for congestion, postnasal drip, sore throat and trouble swallowing. Negative for facial swelling.   Eyes:  Negative for discharge and redness.  Respiratory:  Negative for cough and shortness of breath.   Cardiovascular:  Negative for chest pain and palpitations.  Gastrointestinal:  Negative for abdominal pain and nausea.  Genitourinary:  Negative for dysuria and flank pain.  Musculoskeletal:  Negative  for back pain and gait problem.  Skin:  Negative for pallor and rash.  Neurological:  Negative for syncope and headaches.    Physical Exam Updated Vital Signs BP (!) 130/94 (BP Location: Right Arm)   Pulse (!) 111   Temp 100.2 F (37.9 C) (Oral)   Resp 16   SpO2 99%  Physical Exam Vitals and nursing note reviewed.  Constitutional:      General: She is not in acute distress.    Appearance: Normal appearance. She is well-developed. She is obese. She is not ill-appearing or diaphoretic.  HENT:     Head: Normocephalic and atraumatic. No right periorbital erythema or left  periorbital erythema.     Jaw: There is normal jaw occlusion. No trismus.     Comments: No drooling trismus or stridor    Right Ear: External ear normal.     Left Ear: External ear normal.     Nose: Nose normal.     Mouth/Throat:     Mouth: Mucous membranes are moist.     Dentition: Normal dentition.     Tongue: No lesions.     Pharynx: Oropharynx is clear. Uvula midline. No oropharyngeal exudate or uvula swelling.  Eyes:     General: No scleral icterus.       Right eye: No discharge.        Left eye: No discharge.  Cardiovascular:     Rate and Rhythm: Normal rate and regular rhythm.     Pulses: Normal pulses.     Heart sounds: Normal heart sounds.  Pulmonary:     Effort: Pulmonary effort is normal. No respiratory distress.     Breath sounds: Normal breath sounds.  Abdominal:     General: Abdomen is flat.     Tenderness: There is no abdominal tenderness.  Musculoskeletal:        General: Normal range of motion.     Cervical back: Normal range of motion.     Right lower leg: No edema.     Left lower leg: No edema.  Skin:    General: Skin is warm and dry.     Capillary Refill: Capillary refill takes less than 2 seconds.  Neurological:     Mental Status: She is alert.  Psychiatric:        Mood and Affect: Mood normal.        Behavior: Behavior normal.     ED Results / Procedures / Treatments   Labs (all labs ordered are listed, but only abnormal results are displayed) Labs Reviewed  RESP PANEL BY RT-PCR (FLU A&B, COVID) ARPGX2  GROUP A STREP BY PCR    EKG None  Radiology DG Chest Portable 1 View  Result Date: 12/26/2021 CLINICAL DATA:  Upper respiratory infection EXAM: PORTABLE CHEST 1 VIEW COMPARISON:  06/24/2021 FINDINGS: Lungs volumes are small, but are symmetric and are clear. No pneumothorax or pleural effusion. Cardiac size within normal limits. Pulmonary vascularity is normal. Osseous structures are age-appropriate. No acute bone abnormality. IMPRESSION: No  active disease. Electronically Signed   By: Helyn Numbers M.D.   On: 12/26/2021 02:03    Procedures Procedures    Medications Ordered in ED Medications  lidocaine (XYLOCAINE) 2 % viscous mouth solution 15 mL (has no administration in time range)  dexamethasone (DECADRON) injection 10 mg (has no administration in time range)  ibuprofen (ADVIL) tablet 800 mg (has no administration in time range)    ED Course/ Medical Decision Making/ A&P  Medical Decision Making Amount and/or Complexity of Data Reviewed Radiology: ordered.  Risk OTC drugs. Prescription drug management.   This patient presents to the ED with chief complaint(s) of sore throat with pertinent past medical history of asthma obesity which further complicates the presenting complaint. The complaint involves an extensive differential diagnosis and also carries with it a high risk of complications and morbidity.    The differential diagnosis includes viral pharyngitis, bacterial pharyngitis, PTA, RPA, epiglottitis, URI, viral syndrome, COVID, strep, pneumonia, other acute etiology considered.  Serious etiologies considered.  The initial plan is to viral swabs, strep swab, chest x-ray   Additional history obtained: Additional history obtained from spouse Records reviewed  prior ED visits, prior labs and imaging, urgent care  Independent labs interpretation:  The following labs were independently interpreted: Viral panel, strep screen both were negative  Independent visualization of imaging: - I independently visualized the following imaging with scope of interpretation limited to determining acute life threatening conditions related to emergency care: Chest x-ray, which revealed no acute process  Treatment and Reassessment: Give Decadron, Advil, viscous lidocaine; did improve following this He is breathing comfortably on ambient air. No acute distress Not appear to be septic Low suspicion  for deep space infection  Consultation: - Consulted or discussed management/test interpretation w/ external professional: N/A  Consideration for admission or further workup: Consider hospitalization however feel patient would benefit from further outpatient management.  Social Determinants of health: Obesity   High suspicion for viral pharyngitis  The patient improved significantly and was discharged in stable condition. Detailed discussions were had with the patient regarding current findings, and need for close f/u with PCP or on call doctor. The patient has been instructed to return immediately if the symptoms worsen in any way for re-evaluation. Patient verbalized understanding and is in agreement with current care plan. All questions answered prior to discharge.           Final Clinical Impression(s) / ED Diagnoses Final diagnoses:  Viral pharyngitis    Rx / DC Orders ED Discharge Orders          Ordered    menthol-cetylpyridinium (CEPACOL) 3 MG lozenge  As needed        12/26/21 0217    lidocaine (XYLOCAINE) 2 % solution  As needed        12/26/21 0217    acetaminophen (TYLENOL) 325 MG tablet  Every 6 hours PRN        12/26/21 0217    ibuprofen (ADVIL) 800 MG tablet  Every 6 hours PRN        12/26/21 0217    fluticasone (FLONASE) 50 MCG/ACT nasal spray  Daily        12/26/21 0217              Sloan Leiter, DO 12/26/21 0222

## 2022-04-08 IMAGING — DX DG CHEST 1V PORT
1 series · 1 of 1 positions shown · non-contrast
Comparison: None.

CLINICAL DATA: 82PFW-[id number] positive.

EXAM:
PORTABLE CHEST 1 VIEW

[chest]
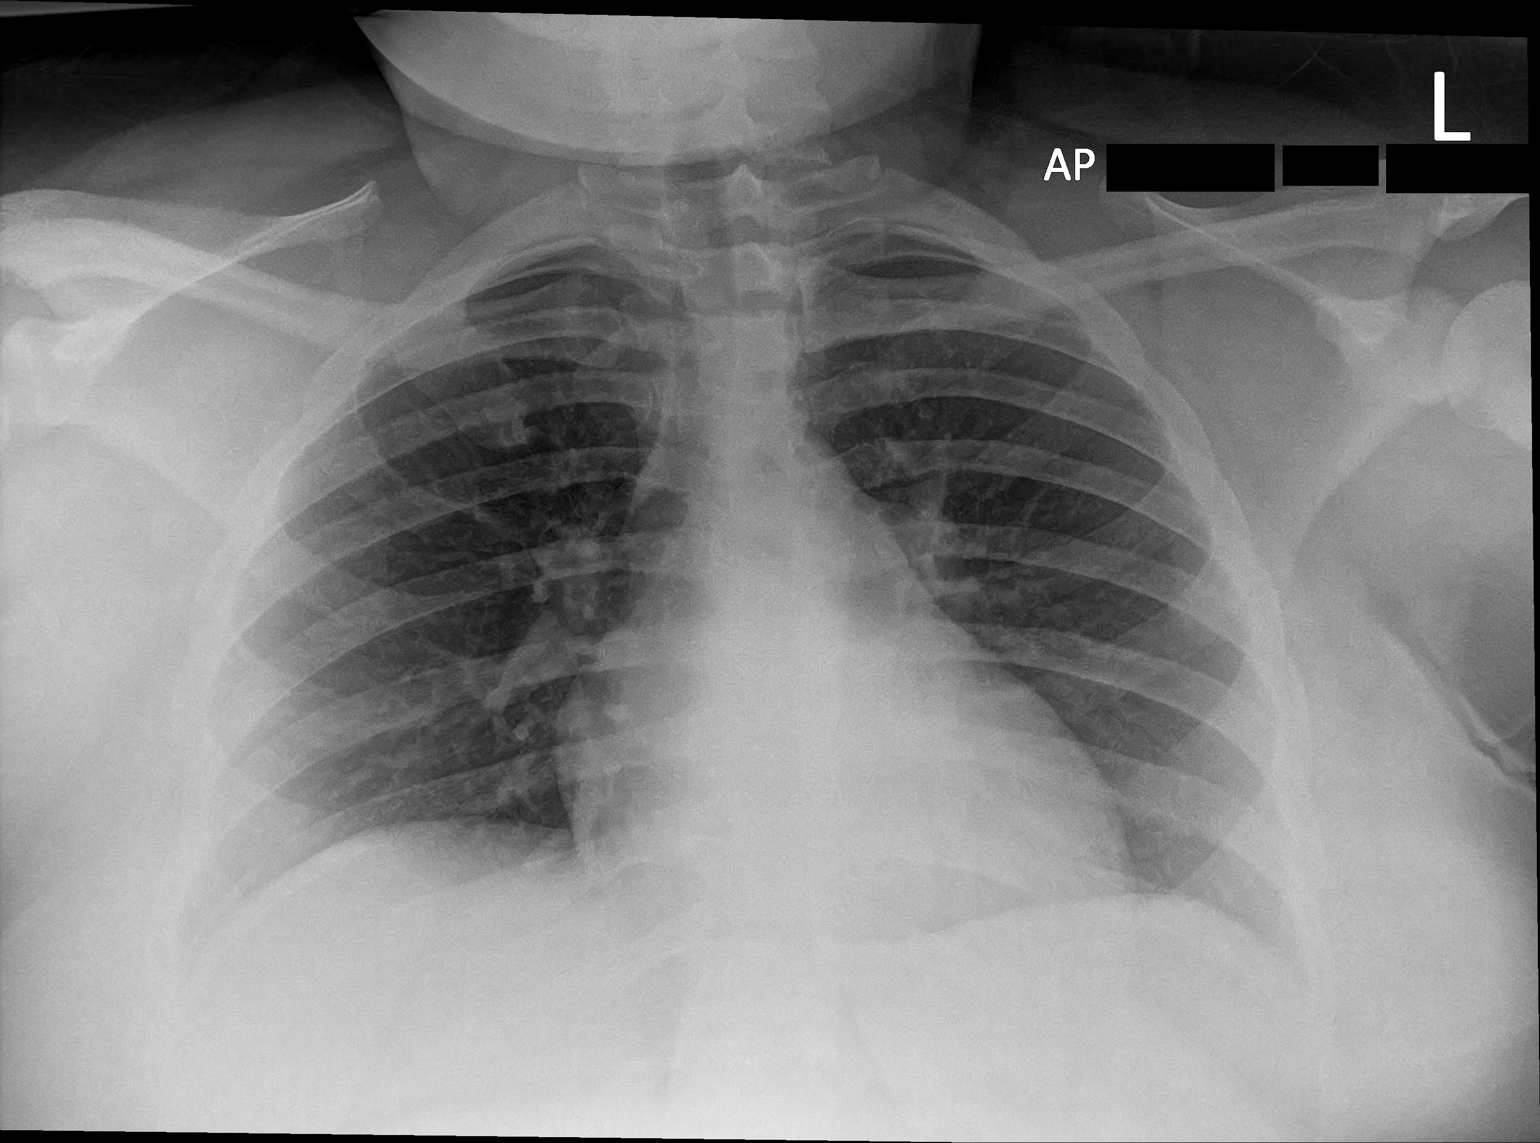

[1 of 1 positions shown; findings below may reference images not displayed]

FINDINGS: The heart size and mediastinal contours are within normal limits.
Both lungs are clear. The visualized skeletal structures are
unremarkable.
IMPRESSION: No active disease.

## 2022-08-15 ENCOUNTER — Emergency Department (HOSPITAL_BASED_OUTPATIENT_CLINIC_OR_DEPARTMENT_OTHER)
Admission: EM | Admit: 2022-08-15 | Discharge: 2022-08-15 | Disposition: A | Discharge status: Home / Self Care | Payer: PRIVATE HEALTH INSURANCE | Attending: Emergency Medicine | Admitting: Emergency Medicine

## 2022-08-15 ENCOUNTER — Encounter (HOSPITAL_BASED_OUTPATIENT_CLINIC_OR_DEPARTMENT_OTHER): Payer: Self-pay | Admitting: Emergency Medicine

## 2022-08-15 ENCOUNTER — Other Ambulatory Visit: Payer: Self-pay

## 2022-08-15 DIAGNOSIS — R221 Localized swelling, mass and lump, neck: Secondary | ICD-10-CM | POA: Insufficient documentation

## 2022-08-15 NOTE — Discharge Instructions (Signed)
Your symptoms may be related to an inflamed lymph node, salivary gland or cyst.  Try taking Tylenol ibuprofen for the discomfort.  Follow-up with a primary care doctor or ENT doctor to be rechecked if the symptoms do not resolve in the next couple of weeks.

## 2022-08-15 NOTE — ED Triage Notes (Signed)
Pt arrives pov, steady gait with c/o "bump" on neck under chin x 5 days. Reports "cold symptoms" over past week.Pt endorses tenderness

## 2022-08-15 NOTE — ED Provider Notes (Signed)
[place] Provider Note   CSN: [id number] Arrival date & time: 08/15/22  [id number]     History  Chief Complaint  Patient presents with   Neck Bump    Ruth Moore is a 27 y.o. female.  HPI   Patient presents to the ED for evaluation of a lump underneath his chin.  Patient states he noticed it a few days ago.  It is mildly tender to the touch.  He has not noticed any redness.  He does feel like he can feel it tug when he swallows.  He has not had any fevers or chills.  No recent injuries.  No tooth ache.  Home Medications Prior to Admission medications   Medication Sig Start Date End Date Taking? Authorizing Provider  acetaminophen (TYLENOL) 325 MG tablet Take 2 tablets (650 mg total) by mouth every 6 (six) hours as needed. 12/26/21   Jeanell Sparrow, DO  fluticasone (FLONASE) 50 MCG/ACT nasal spray Place 1 spray into both nostrils daily for 14 days. 12/26/21 01/09/22  Wynona Dove A, DO  ibuprofen (ADVIL) 800 MG tablet Take 1 tablet (800 mg total) by mouth 3 (three) times daily. 08/01/20   Hughie Closs, PA-C  ibuprofen (ADVIL) 800 MG tablet Take 1 tablet (800 mg total) by mouth every 6 (six) hours as needed (take with food). 12/26/21   Jeanell Sparrow, DO  lidocaine (XYLOCAINE) 2 % solution Use as directed 15 mLs in the mouth or throat as needed for mouth pain. 12/26/21   Jeanell Sparrow, DO  menthol-cetylpyridinium (CEPACOL) 3 MG lozenge Take 1 lozenge (3 mg total) by mouth as needed for sore throat. 12/26/21   Jeanell Sparrow, DO  methocarbamol (ROBAXIN) 500 MG tablet Take 2 tablets (1,000 mg total) by mouth 4 (four) times daily. Patient not taking: Reported on 01/28/2021 04/18/16   Carlisle Cater, PA-C  phenazopyridine (PYRIDIUM) 200 MG tablet Take 1 tablet (200 mg total) by mouth 3 (three) times daily. Patient not taking: Reported on 01/28/2021 01/27/14   Ashley Murrain, NP  predniSONE (DELTASONE) 10 MG tablet Take 4 tablets (40 mg total) by mouth  daily. 03/23/21   Fredia Sorrow, MD  fexofenadine (ALLEGRA) 180 MG tablet Take 180 mg by mouth daily.  09/10/11  [provider]      Allergies    Penicillins    Review of Systems   Review of Systems  Physical Exam Updated Vital Signs BP (!) 145/89   Pulse 80   Temp 97.9 F (36.6 C) (Oral)   Resp 18   Ht 1.676 m (5\' 6" )   Wt (!) 145.2 kg   SpO2 100%   BMI 51.65 kg/m  Physical Exam Vitals and nursing note reviewed.  Constitutional:      General: She is not in acute distress.    Appearance: She is well-developed.  HENT:     Head: Normocephalic and atraumatic.     Comments: Approximately 1 cm mobile nodule midline submental region, no fluctuance, no erythema, no lymphadenopathy noted anterior cervical chain, posterior cervical chain, no lesions noted on the tongue, no oropharyngeal exudate or erythema    Right Ear: External ear normal.     Left Ear: External ear normal.     Mouth/Throat:     Pharynx: No oropharyngeal exudate or posterior oropharyngeal erythema.  Eyes:     General: No scleral icterus.       Right eye: No discharge.  Left eye: No discharge.     Conjunctiva/sclera: Conjunctivae normal.  Neck:     Trachea: No tracheal deviation.  Cardiovascular:     Rate and Rhythm: Normal rate.  Pulmonary:     Effort: Pulmonary effort is normal. No respiratory distress.     Breath sounds: No stridor.  Abdominal:     General: There is no distension.  Musculoskeletal:        General: No swelling or deformity.     Cervical back: Neck supple.  Skin:    General: Skin is warm and dry.     Findings: No rash.  Neurological:     Mental Status: She is alert. Mental status is at baseline.     Cranial Nerves: No dysarthria or facial asymmetry.     Motor: No seizure activity.     ED Results / Procedures / Treatments   Labs (all labs ordered are listed, but only abnormal results are displayed) Labs Reviewed - No data to display  EKG None  Radiology No  results found.  Procedures Procedures    Medications Ordered in ED Medications - No data to display  ED Course/ Medical Decision Making/ A&P                             Medical Decision Making  Patient with small nodule palpated in the submental region.  No signs of infection.  No signs of tongue lesion or mass to suggest malignancy.  This could be a a lymph node or possible inflamed or enlarged salivary duct.  Will have patient take over-the-counter medications such as Tylenol or ibuprofen.  Discussed returning to the ED if he starts having fevers chills increasing swelling or worsening symptoms.  Otherwise follow-up with ENT if the lump persists and does not resolve.        Final Clinical Impression(s) / ED Diagnoses Final diagnoses:  Neck nodule    Rx / DC Orders ED Discharge Orders     None         Dorie Rank, MD 08/15/22 [phone]

## 2022-08-15 NOTE — ED Notes (Signed)
ED Provider at bedside. 

## 2022-12-14 ENCOUNTER — Encounter (HOSPITAL_BASED_OUTPATIENT_CLINIC_OR_DEPARTMENT_OTHER): Payer: Self-pay | Admitting: Emergency Medicine

## 2022-12-14 ENCOUNTER — Other Ambulatory Visit: Payer: Self-pay

## 2022-12-14 DIAGNOSIS — J069 Acute upper respiratory infection, unspecified: Secondary | ICD-10-CM | POA: Insufficient documentation

## 2022-12-14 DIAGNOSIS — Z1152 Encounter for screening for COVID-19: Secondary | ICD-10-CM | POA: Insufficient documentation

## 2022-12-14 NOTE — ED Triage Notes (Signed)
Pt via pov from home with body aches today; states she had cold symptoms a few days ago and they have lingered as well - some runny nose and some coughing. Pt alert & oriented, nad noted.

## 2022-12-15 ENCOUNTER — Emergency Department (HOSPITAL_BASED_OUTPATIENT_CLINIC_OR_DEPARTMENT_OTHER)
Admission: EM | Admit: 2022-12-15 | Discharge: 2022-12-15 | Disposition: A | Discharge status: Home / Self Care | Payer: PRIVATE HEALTH INSURANCE | Attending: Emergency Medicine | Admitting: Emergency Medicine

## 2022-12-15 DIAGNOSIS — J069 Acute upper respiratory infection, unspecified: Secondary | ICD-10-CM

## 2022-12-15 NOTE — ED Provider Notes (Signed)
[place] EMERGENCY DEPARTMENT AT [facility]  Provider Note  CSN: [id number] Arrival date & time: 12/14/22 2256  History Chief Complaint  Patient presents with   Generalized Body Aches   URI    Ruth Moore is a 27 y.o. adult with no significant PMH reports they have had cough and congestion for a few days but today at work began having body aches, chills and tactile fever. They left work early and came to the ED for evaluation.    Home Medications Prior to Admission medications   Medication Sig Start Date End Date Taking? Authorizing Provider  acetaminophen (TYLENOL) 325 MG tablet Take 2 tablets (650 mg total) by mouth every 6 (six) hours as needed. 12/26/21   Sloan Leiter, DO  fluticasone (FLONASE) 50 MCG/ACT nasal spray Place 1 spray into both nostrils daily for 14 days. 12/26/21 01/09/22  [name] A, DO  ibuprofen (ADVIL) 800 MG tablet Take 1 tablet (800 mg total) by mouth 3 (three) times daily. 08/01/20   Rushie Chestnut, PA-C  ibuprofen (ADVIL) 800 MG tablet Take 1 tablet (800 mg total) by mouth every 6 (six) hours as needed (take with food). 12/26/21   Sloan Leiter, DO  lidocaine (XYLOCAINE) 2 % solution Use as directed 15 mLs in the mouth or throat as needed for mouth pain. 12/26/21   Sloan Leiter, DO  menthol-cetylpyridinium (CEPACOL) 3 MG lozenge Take 1 lozenge (3 mg total) by mouth as needed for sore throat. 12/26/21   Sloan Leiter, DO  methocarbamol (ROBAXIN) 500 MG tablet Take 2 tablets (1,000 mg total) by mouth 4 (four) times daily. Patient not taking: Reported on 01/28/2021 04/18/16   Renne Crigler, PA-C  phenazopyridine (PYRIDIUM) 200 MG tablet Take 1 tablet (200 mg total) by mouth 3 (three) times daily. Patient not taking: Reported on 01/28/2021 01/27/14   Janne Napoleon, NP  predniSONE (DELTASONE) 10 MG tablet Take 4 tablets (40 mg total) by mouth daily. 03/23/21   Vanetta Mulders, MD  fexofenadine (ALLEGRA) 180 MG tablet Take 180 mg by mouth daily.   09/10/11  [provider]     Allergies    Penicillins   Review of Systems   Review of Systems Please see HPI for pertinent positives and negatives  Physical Exam BP 131/83 (BP Location: Right Arm)   Pulse (!) 108   Temp 99.2 F (37.3 C) (Oral)   Resp 16   Ht 5\' 6"  (1.676 m)   Wt (!) 145.2 kg   SpO2 100%   BMI 51.65 kg/m   Physical Exam Vitals and nursing note reviewed.  Constitutional:      Appearance: Normal appearance.  HENT:     Head: Normocephalic and atraumatic.     Nose: Nose normal.     Mouth/Throat:     Mouth: Mucous membranes are moist.  Eyes:     Extraocular Movements: Extraocular movements intact.     Conjunctiva/sclera: Conjunctivae normal.  Cardiovascular:     Rate and Rhythm: Normal rate.  Pulmonary:     Effort: Pulmonary effort is normal.     Breath sounds: Normal breath sounds.  Abdominal:     General: Abdomen is flat.     Palpations: Abdomen is soft.     Tenderness: There is no abdominal tenderness.  Musculoskeletal:        General: No swelling. Normal range of motion.     Cervical back: Neck supple.  Skin:    General: Skin is  warm and dry.  Neurological:     General: No focal deficit present.     Mental Status: He is alert.  Psychiatric:        Mood and Affect: Mood normal.     ED Results / Procedures / Treatments   EKG None  Procedures Procedures  Medications Ordered in the ED Medications - No data to display  Initial Impression and Plan  Patient here with mild URI symptoms, well appearing. Exam and vitals are reassuring. Covid test is negative. Recommend continued symptomatic care at home. Rest, hydration and PCP follow up, RTED for any other concerns.    ED Course       MDM Rules/Calculators/A&P Medical Decision Making Problems Addressed: Viral URI with cough: acute illness or injury  Amount and/or Complexity of Data Reviewed Labs: ordered. Decision-making details documented in ED Course.  Risk OTC  drugs.     Final Clinical Impression(s) / ED Diagnoses Final diagnoses:  Viral URI with cough    Rx / DC Orders ED Discharge Orders     None        Pollyann Savoy, MD 12/15/22 [phone]

## 2023-03-29 ENCOUNTER — Emergency Department (HOSPITAL_BASED_OUTPATIENT_CLINIC_OR_DEPARTMENT_OTHER)
Admission: EM | Admit: 2023-03-29 | Discharge: 2023-03-29 | Disposition: A | Discharge status: Home / Self Care | Payer: Self-pay | Attending: Emergency Medicine | Admitting: Emergency Medicine

## 2023-03-29 ENCOUNTER — Encounter (HOSPITAL_BASED_OUTPATIENT_CLINIC_OR_DEPARTMENT_OTHER): Payer: Self-pay | Admitting: Emergency Medicine

## 2023-03-29 ENCOUNTER — Other Ambulatory Visit: Payer: Self-pay

## 2023-03-29 DIAGNOSIS — J4521 Mild intermittent asthma with (acute) exacerbation: Secondary | ICD-10-CM | POA: Insufficient documentation

## 2023-03-29 DIAGNOSIS — U071 COVID-19: Secondary | ICD-10-CM | POA: Insufficient documentation

## 2023-03-29 DIAGNOSIS — Z7951 Long term (current) use of inhaled steroids: Secondary | ICD-10-CM | POA: Insufficient documentation

## 2023-03-29 LAB — RESP PANEL BY RT-PCR (RSV, FLU A&B, COVID)  RVPGX2
Influenza A by PCR: NEGATIVE
Influenza B by PCR: NEGATIVE
Resp Syncytial Virus by PCR: NEGATIVE
SARS Coronavirus 2 by RT PCR: POSITIVE — AB

## 2023-03-29 MED ORDER — ALBUTEROL SULFATE HFA 108 (90 BASE) MCG/ACT IN AERS
2.0000 | INHALATION_SPRAY | RESPIRATORY_TRACT | Status: DC | PRN
  Administered 2023-03-29: 2 via RESPIRATORY_TRACT
  Filled 2023-03-29: qty 6.7

## 2023-03-29 MED ORDER — AEROCHAMBER PLUS FLO-VU LARGE MISC
1.0000 | Freq: Once | Status: AC
  Administered 2023-03-29: 1
  Filled 2023-03-29: qty 1

## 2023-03-29 MED ORDER — DEXAMETHASONE 4 MG PO TABS
10.0000 mg | ORAL_TABLET | Freq: Once | ORAL | Status: AC
  Administered 2023-03-29: 10 mg via ORAL
  Filled 2023-03-29: qty 3

## 2023-03-29 NOTE — ED Provider Notes (Signed)
[place] EMERGENCY DEPARTMENT AT [facility] Provider Note   CSN: [id number] Arrival date & time: 03/29/23  1708     History  Chief Complaint  Patient presents with   Cough    Ruth Moore is a 27 y.o. adult.  Patient is a 27 year old female with a history of asthma presenting today with 5 to 6 days of URI symptoms.  She reports initially just cough, congestion but in the last few days has had worsening cough with sputum production, wheezing at night when she tries to lay down, malaise and myalgias as well as subjective fevers and chills.  She had another colleague who recently tested positive for COVID today.  She has not used an inhaler in years and has not had one at home.  No vomiting or diarrhea.  The history is provided by the patient.  Cough      Home Medications Prior to Admission medications   Medication Sig Start Date End Date Taking? Authorizing Provider  acetaminophen (TYLENOL) 325 MG tablet Take 2 tablets (650 mg total) by mouth every 6 (six) hours as needed. 12/26/21   Sloan Leiter, DO  fluticasone (FLONASE) 50 MCG/ACT nasal spray Place 1 spray into both nostrils daily for 14 days. 12/26/21 01/09/22  [name] A, DO  ibuprofen (ADVIL) 800 MG tablet Take 1 tablet (800 mg total) by mouth 3 (three) times daily. 08/01/20   Rushie Chestnut, PA-C  ibuprofen (ADVIL) 800 MG tablet Take 1 tablet (800 mg total) by mouth every 6 (six) hours as needed (take with food). 12/26/21   Sloan Leiter, DO  lidocaine (XYLOCAINE) 2 % solution Use as directed 15 mLs in the mouth or throat as needed for mouth pain. 12/26/21   Sloan Leiter, DO  menthol-cetylpyridinium (CEPACOL) 3 MG lozenge Take 1 lozenge (3 mg total) by mouth as needed for sore throat. 12/26/21   Sloan Leiter, DO  methocarbamol (ROBAXIN) 500 MG tablet Take 2 tablets (1,000 mg total) by mouth 4 (four) times daily. Patient not taking: Reported on 01/28/2021 04/18/16   Renne Crigler, PA-C  phenazopyridine  (PYRIDIUM) 200 MG tablet Take 1 tablet (200 mg total) by mouth 3 (three) times daily. Patient not taking: Reported on 01/28/2021 01/27/14   Janne Napoleon, NP  predniSONE (DELTASONE) 10 MG tablet Take 4 tablets (40 mg total) by mouth daily. 03/23/21   Vanetta Mulders, MD  fexofenadine (ALLEGRA) 180 MG tablet Take 180 mg by mouth daily.  09/10/11  [provider]      Allergies    Penicillins    Review of Systems   Review of Systems  Respiratory:  Positive for cough.     Physical Exam Updated Vital Signs BP (!) 130/92   Pulse 82   Temp 98.5 F (36.9 C) (Oral)   Resp 20   LMP 03/22/2023 (Approximate)   SpO2 100%   Breastfeeding No  Physical Exam Vitals and nursing note reviewed.  Constitutional:      General: He is not in acute distress.    Appearance: He is well-developed.  HENT:     Head: Normocephalic and atraumatic.     Right Ear: Tympanic membrane normal.     Left Ear: Tympanic membrane normal.     Nose: Congestion present.     Mouth/Throat:     Mouth: Mucous membranes are moist.     Pharynx: No posterior oropharyngeal erythema.  Eyes:     Pupils: Pupils are equal, round, and reactive  to light.  Cardiovascular:     Rate and Rhythm: Normal rate and regular rhythm.     Heart sounds: Normal heart sounds. No murmur heard.    No friction rub.  Pulmonary:     Effort: Pulmonary effort is normal.     Breath sounds: Normal breath sounds. No wheezing or rales.  Abdominal:     General: Bowel sounds are normal. There is no distension.     Palpations: Abdomen is soft.     Tenderness: There is no abdominal tenderness. There is no guarding or rebound.  Musculoskeletal:        General: No tenderness. Normal range of motion.     Comments: No edema  Skin:    General: Skin is warm and dry.     Findings: No rash.  Neurological:     Mental Status: He is alert and oriented to person, place, and time.     Cranial Nerves: No cranial nerve deficit.  Psychiatric:         Behavior: Behavior normal.     ED Results / Procedures / Treatments   Labs (all labs ordered are listed, but only abnormal results are displayed) Labs Reviewed  RESP PANEL BY RT-PCR (RSV, FLU A&B, COVID)  [id number] - Abnormal; Notable for the following components:      Result Value   SARS Coronavirus 2 by RT PCR POSITIVE (*)    All other components within normal limits    EKG None  Radiology No results found.  Procedures Procedures    Medications Ordered in ED Medications  albuterol (VENTOLIN HFA) 108 (90 Base) MCG/ACT inhaler 2 puff (2 puffs Inhalation Given 03/29/23 2223)  dexamethasone (DECADRON) tablet 10 mg (10 mg Oral Given 03/29/23 2231)  AeroChamber Plus Flo-Vu Large MISC 1 each (1 each Other Given 03/29/23 2225)    ED Course/ Medical Decision Making/ A&P                                 Medical Decision Making Risk Prescription drug management.   Pt with symptoms consistent with viral URI.  Well appearing here.  No signs of breathing difficulty  No signs of pharyngitis, otitis or abnormal abdominal findings.   COVID positive today.  Lungs are clear without wheezing at this time however concern for developing asthma exacerbation due to the viral illness.  Patient given a new inhaler to use as needed.  Given 1 dose of Decadron here.  l and pt to return with any further problems.         Final Clinical Impression(s) / ED Diagnoses Final diagnoses:  COVID  Mild intermittent asthma with exacerbation    Rx / DC Orders ED Discharge Orders     None         Gwyneth Sprout, MD 03/29/23 2322

## 2023-03-29 NOTE — ED Triage Notes (Addendum)
Cough, body ache since Friday Coworker + covid

## 2023-03-29 NOTE — Discharge Instructions (Addendum)
Continue to stay hydrated, rest.  Use the inhaler every 6 hours as needed for coughing or wheezing.  You may want to try saline spray for your nose to clean that out before laying down at night.  If you start having severe shortness of breath, high fevers or other concerns return to the emergency room.
